# Patient Record
Sex: Male | Born: 1995 | Race: White | Hispanic: No | Marital: Single | Smoking: Never smoker
Health system: Southern US, Community
[De-identification: ages and names within clinical notes are randomized; demographics above are authoritative.]

## PROBLEM LIST (undated history)

## (undated) DIAGNOSIS — Z8489 Family history of other specified conditions: Secondary | ICD-10-CM

## (undated) DIAGNOSIS — F419 Anxiety disorder, unspecified: Secondary | ICD-10-CM

## (undated) DIAGNOSIS — K219 Gastro-esophageal reflux disease without esophagitis: Secondary | ICD-10-CM

---
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
## 2019-02-02 ENCOUNTER — Other Ambulatory Visit: Payer: Self-pay

## 2019-02-02 ENCOUNTER — Ambulatory Visit (INDEPENDENT_AMBULATORY_CARE_PROVIDER_SITE_OTHER): Payer: BC Managed Care – PPO | Admitting: Psychiatry

## 2019-02-02 ENCOUNTER — Encounter (HOSPITAL_COMMUNITY): Payer: Self-pay | Admitting: Psychiatry

## 2019-02-02 DIAGNOSIS — F411 Generalized anxiety disorder: Secondary | ICD-10-CM

## 2019-02-02 MED ORDER — SERTRALINE HCL 100 MG PO TABS: 150.0000 mg | ORAL_TABLET | Freq: Every day | ORAL | 1 refills | Status: DC
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
## 2019-02-25 ENCOUNTER — Other Ambulatory Visit (HOSPITAL_COMMUNITY): Payer: Self-pay | Admitting: Psychiatry

## 2019-03-23 ENCOUNTER — Ambulatory Visit (INDEPENDENT_AMBULATORY_CARE_PROVIDER_SITE_OTHER): Payer: BC Managed Care – PPO | Admitting: Psychiatry

## 2019-03-23 ENCOUNTER — Other Ambulatory Visit: Payer: Self-pay

## 2019-03-23 DIAGNOSIS — F411 Generalized anxiety disorder: Secondary | ICD-10-CM
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
## 2019-06-22 ENCOUNTER — Ambulatory Visit (INDEPENDENT_AMBULATORY_CARE_PROVIDER_SITE_OTHER): Payer: BC Managed Care – PPO | Admitting: Psychiatry

## 2019-06-22 ENCOUNTER — Other Ambulatory Visit: Payer: Self-pay

## 2019-06-22 DIAGNOSIS — F411 Generalized anxiety disorder: Secondary | ICD-10-CM

## 2019-06-22 MED ORDER — SERTRALINE HCL 100 MG PO TABS: ORAL_TABLET | ORAL | 1 refills | Status: DC
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
## 2019-09-20 ENCOUNTER — Ambulatory Visit (INDEPENDENT_AMBULATORY_CARE_PROVIDER_SITE_OTHER): Payer: BC Managed Care – PPO | Admitting: Psychiatry

## 2019-09-20 ENCOUNTER — Other Ambulatory Visit: Payer: Self-pay

## 2019-09-20 DIAGNOSIS — F411 Generalized anxiety disorder: Secondary | ICD-10-CM | POA: Diagnosis not present
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
## 2019-12-20 ENCOUNTER — Other Ambulatory Visit: Payer: Self-pay

## 2019-12-20 ENCOUNTER — Telehealth (INDEPENDENT_AMBULATORY_CARE_PROVIDER_SITE_OTHER): Payer: BC Managed Care – PPO | Admitting: Psychiatry

## 2019-12-20 DIAGNOSIS — F411 Generalized anxiety disorder: Secondary | ICD-10-CM

## 2019-12-20 MED ORDER — SERTRALINE HCL 50 MG PO TABS: 50.0000 mg | ORAL_TABLET | Freq: Every day | ORAL | 0 refills | Status: DC

## 2019-12-20 NOTE — Progress Notes (Signed)
BH MD/PA/NP OP Progress Note  12/20/2019 2:11 PM Marvin Kerr  MRN:  [id number] Interview was conducted by phone and I verified that I was speaking with the correct person using two identifiers. I discussed the limitations of evaluation and management by telemedicine and  the availability of in person appointments. Patient expressed understanding and agreed to proceed. Patient location - home; physician - home office.  Chief Complaint: Anxiety.  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 He has been under care of [name] at [facility] between 2016 and 2018. He has been successfully treated with sertraline (dose up to 150 mg) but after he went to college ([employer]) in 2018 he stopped seeing his provider and eventually stopped taking medication. Anxiety eventually returned. He transfered out of [employer] and resumed studies at [employer]. Marvin Kerr restarted sertraline but when the dose was increased to150 mg, Marvin Kerr was on in the past,he could not tolerate resulting nausea and decreased dose back to 100 mg. He has been forgetting to take it and eventually stopped it few weeks ago. Anxiety again came back and Marvin Kerr would like to restart sertraline perhaps at a lower dose.   Visit Diagnosis:    ICD-10-CM   1. GAD (generalized anxiety disorder)  F41.1     Past Psychiatric History: Please see intake H&P.  Past Medical History:  Past Medical History:  Diagnosis Date  . Anxiety   . Family history of adverse reaction to anesthesia    mother and maternal grandmother - PONV  . GERD (gastroesophageal reflux disease)   . Premature birth    32 wks. wt=3lb,5oz. stayed 1 month because of wt    Past Surgical History:  Procedure Laterality Date  . 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    Family Psychiatric History: None.  Family History: No family history on file.  Social History:  Social History   Socioeconomic History  . Marital status: Single    Spouse name: Not on file  . Number of children: 0  . Years of education: Not on file  . Highest education level: Not on file  Occupational History    Employer: [employer]  Tobacco Use  . Smoking status: Never Smoker  . Smokeless tobacco: Never Used  Substance and Sexual Activity  . Alcohol use: No  . Drug use: No  . Sexual activity: Not on file  Other Topics Concern  . Not on file  Social History Narrative  . Not on file   Social Determinants of Health   Financial Resource Strain:   . Difficulty of Paying Living Expenses:   Food Insecurity:   . Worried About Programme researcher, broadcasting/film/video in the Last Year:   . Barista in the Last Year:   Transportation Needs:   . Freight forwarder (Medical):   Marland Kitchen Lack of Transportation (Non-Medical):   Physical Activity:   . Days of Exercise per Week:   . Minutes of Exercise per Session:   Stress:   . Feeling of Stress :   Social Connections:   . Frequency of Communication with Friends and Family:   . Frequency of Social Gatherings with Friends and Family:   . Attends Religious Services:   . Active Member of Clubs or Organizations:   . Attends Club or  Organization Meetings:   Marland Kitchen Marital Status:     Allergies: No Known Allergies  Metabolic Disorder Labs: No results found for: HGBA1C, MPG No results found for: PROLACTIN No results found for: CHOL, TRIG, HDL, CHOLHDL, VLDL, LDLCALC Lab Results  Component Value Date   TSH 1.36 10/11/2013    Therapeutic Level Labs: No results found for: LITHIUM No results found for: VALPROATE No components found for:  CBMZ  Current Medications: Current Outpatient Medications  Medication Sig Dispense Refill  . sertraline (ZOLOFT) 50 MG tablet Take 1 tablet (50 mg total) by mouth daily. TAKE 1.5  TABLETS BY MOUTH IN THE MORNING 90 tablet 0   No current facility-administered medications for this visit.      Psychiatric Specialty Exam: Review of Systems  Psychiatric/Behavioral: The patient is nervous/anxious.   All other systems reviewed and are negative.   There were no vitals taken for this visit.There is no height or weight on file to calculate BMI.  General Appearance: NA  Eye Contact:  NA  Speech:  Clear and Coherent and Normal Rate  Volume:  Normal  Mood:  Anxious  Affect:  NA  Thought Process:  Goal Directed  Orientation:  Full (Time, Place, and Person)  Thought Content: Rumination   Suicidal Thoughts:  No  Homicidal Thoughts:  No  Memory:  Immediate;   Good Recent;   Good Remote;   Good  Judgement:  Good  Insight:  Good  Psychomotor Activity:  NA  Concentration:  Concentration: Good  Recall:  Good  Fund of Knowledge: Good  Language: Good  Akathisia:  Negative  Handed:  Right  AIMS (if indicated): not done  Assets:  Communication Skills Desire for Improvement Financial Resources/Insurance Housing Talents/Skills  ADL's:  Intact  Cognition: WNL  Sleep:  Good    Assessment and Plan: 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 He has been under care of Dr. Liana Gerold at [facility] between 2016 and 2018. He has been successfully treated with sertraline (dose up to 150 mg) but after he went to college ([employer]) in 2018 he stopped seeing his provider and eventually stopped taking medication. Anxiety eventually returned. He transfered out of [place] and resumed studies at [employer]. Purcell Kerr restarted sertraline but when the dose was increased to150 mg, Marvin Kerr was on in the past,he could not tolerate resulting nausea and decreased dose back to 100 mg. He has been forgetting to take  it and eventually stopped it few weeks ago. Anxiety again came back and Marvin Kerr would like to restart sertraline perhaps at a lower dose.   Dx: GAD   Plan:Restart sertralineat 50 mg daily - if anxiety does not subside after few weeks he can increase dose to 75 or 100 mg. Next appointment in3 monthsor prn.The plan was discussed with patient who had an opportunity to ask questions and these were all answered. I spend15 minutes inphone consultationwiththe patient.   Stephanie Acre, MD 12/20/2019, 2:11 PM

## 2020-03-21 ENCOUNTER — Telehealth (INDEPENDENT_AMBULATORY_CARE_PROVIDER_SITE_OTHER): Payer: BC Managed Care – PPO | Admitting: Psychiatry

## 2020-03-21 ENCOUNTER — Other Ambulatory Visit: Payer: Self-pay

## 2020-03-21 DIAGNOSIS — F411 Generalized anxiety disorder: Secondary | ICD-10-CM | POA: Diagnosis not present

## 2020-03-21 MED ORDER — SERTRALINE HCL 50 MG PO TABS: 50.0000 mg | ORAL_TABLET | Freq: Every day | ORAL | 1 refills | Status: DC

## 2020-03-21 NOTE — Progress Notes (Signed)
BH MD/PA/NP OP Progress Note  03/21/2020 2:07 PM Marvin Kerr  MRN:  [id number] Interview was conducted by phone and I verified that I was speaking with the correct person using two identifiers. I discussed the limitations of evaluation and management by telemedicine and  the availability of in person appointments. Patient expressed understanding and agreed to proceed. Patient location - home; physician - home office.  Chief Complaint: None.  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 He has been under care of [name] at [facility] between 2016 and 2018. He has been successfully treated with sertraline (dose up to 150 mg) but after he went to college ([employer]) in 2018 he stopped seeing his provider and eventually stopped taking medication. Anxiety eventually returned. He transfered out of [place] and resumed studies at [employer].Vela Prose restarted sertraline but when the dose was increased to150 mg, ashe was on in the past,he could not tolerate resulting nausea and decreased dose back to 100 mg. He has been forgetting to take it and eventually stopped it again only to experience return of anxiety. Over past three months he has been compliant with taking it at 50 mg daily and anxiety is well controlled.He denies feeling depressed, sleep and appetite are good, no SI.   Visit Diagnosis:    ICD-10-CM   1. GAD (generalized anxiety disorder)  F41.1     Past Psychiatric History: Please see intake H&P.  Past Medical History:  Past Medical History:  Diagnosis Date  . Anxiety   . Family history of adverse reaction to anesthesia    mother and maternal grandmother - PONV  . GERD (gastroesophageal reflux disease)   . Premature birth    32 wks. wt=3lb,5oz. stayed 1 month because of wt    Past Surgical  History:  Procedure Laterality Date  . 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    Family Psychiatric History: None.  Family History: No family history on file.  Social History:  Social History   Socioeconomic History  . Marital status: Single    Spouse name: Not on file  . Number of children: 0  . Years of education: Not on file  . Highest education level: Not on file  Occupational History    Employer: [employer]  Tobacco Use  . Smoking status: Never Smoker  . Smokeless tobacco: Never Used  Substance and Sexual Activity  . Alcohol use: No  . Drug use: No  . Sexual activity: Not on file  Other Topics Concern  . Not on file  Social History Narrative  . Not on file   Social Determinants of Health   Financial Resource Strain:   . Difficulty of Paying Living Expenses: Not on file  Food Insecurity:   . Worried About Programme researcher, broadcasting/film/video in the Last Year: Not on file  . Ran Out of Food in the Last Year: Not on file  Transportation Needs:   . Lack of Transportation (Medical): Not on file  . Lack of Transportation (Non-Medical): Not on file  Physical Activity:   . Days of Exercise per Week: Not on file  . Minutes of Exercise per Session: Not on file  Stress:   . Feeling of Stress : Not on file  Social Connections:   . Frequency of Communication with  Friends and Family: Not on file  . Frequency of Social Gatherings with Friends and Family: Not on file  . Attends Religious Services: Not on file  . Active Member of Clubs or Organizations: Not on file  . Attends Banker Meetings: Not on file  . Marital Status: Not on file    Allergies: No Known Allergies  Metabolic Disorder Labs: No results found for: HGBA1C, MPG No results found for: PROLACTIN No results found for: CHOL, TRIG, HDL, CHOLHDL, VLDL, LDLCALC Lab Results  Component Value Date   TSH 1.36 10/11/2013    Therapeutic Level Labs: No  results found for: LITHIUM No results found for: VALPROATE No components found for:  CBMZ  Current Medications: Current Outpatient Medications  Medication Sig Dispense Refill  . sertraline (ZOLOFT) 50 MG tablet Take 1 tablet (50 mg total) by mouth daily. 90 tablet 1   No current facility-administered medications for this visit.      Psychiatric Specialty Exam: Review of Systems  All other systems reviewed and are negative.   There were no vitals taken for this visit.There is no height or weight on file to calculate BMI.  General Appearance: NA  Eye Contact:  NA  Speech:  Clear and Coherent and Normal Rate  Volume:  Normal  Mood:  Euthymic  Affect:  NA  Thought Process:  Goal Directed and Linear  Orientation:  Full (Time, Place, and Person)  Thought Content: Logical   Suicidal Thoughts:  No  Homicidal Thoughts:  No  Memory:  Immediate;   Good Recent;   Good Remote;   Good  Judgement:  Good  Insight:  Good  Psychomotor Activity:  NA  Concentration:  Concentration: Good  Recall:  Good  Fund of Knowledge: Good  Language: Good  Akathisia:  Negative  Handed:  Right  AIMS (if indicated): not done  Assets:  Communication Skills Desire for Improvement Financial Resources/Insurance Housing Physical Health Social Support Vocational/Educational  ADL's:  Intact  Cognition: WNL  Sleep:  Good    Assessment and Plan: 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 He has been under care of [name] at [facility] between 2016 and 2018. He has been successfully treated with sertraline (dose up to 150 mg) but after he went to college ([employer]) in 2018 he stopped seeing his provider and eventually stopped taking medication. Anxiety eventually returned. He transfered out of [place] and resumed studies  at [employer].Vela Prose restarted sertraline but when the dose was increased to150 mg, ashe was on in the past,he could not tolerate resulting nausea and decreased dose back to 100 mg. He has been forgetting to take it and eventually stopped it again only to experience return of anxiety. Over past three months he has been compliant with taking it at 50 mg daily and anxiety is well controlled.He denies feeling depressed, sleep and appetite are good, no SI.  Dx: GAD   Plan:Continue sertralineat 50 mg daily. Next appointment in3 monthsor prn.The plan was discussed with patient who had an opportunity to ask questions and these were all answered. I spend66minutes inphone consultationwiththe patient.    [name], MD 03/21/2020, 2:07 PM

## 2020-03-26 ENCOUNTER — Other Ambulatory Visit (HOSPITAL_COMMUNITY): Payer: Self-pay | Admitting: Psychiatry

## 2020-06-20 ENCOUNTER — Telehealth (INDEPENDENT_AMBULATORY_CARE_PROVIDER_SITE_OTHER): Payer: BC Managed Care – PPO | Admitting: Psychiatry

## 2020-06-20 ENCOUNTER — Other Ambulatory Visit: Payer: Self-pay

## 2020-06-20 DIAGNOSIS — F411 Generalized anxiety disorder: Secondary | ICD-10-CM

## 2020-06-20 NOTE — Progress Notes (Signed)
BH MD/PA/NP OP Progress Note  06/20/2020 11:07 AM Marvin Kerr  MRN:  [id number] Interview was conducted by phone and I verified that I was speaking with the correct person using two identifiers. I discussed the limitations of evaluation and management by telemedicine and  the availability of in person appointments. Patient expressed understanding and agreed to proceed. Participants in the visit: patient (location - home); physician (location - home office).  Chief Complaint: None.  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 He has been under care of [name] at [facility] between 2016 and 2018. He has been successfully treated with sertraline (dose up to 150 mg) but after he went to college ([employer]) in 2018 he stopped seeing his provider and eventually stopped taking medication. Anxietyeventually returned. He transferedout of [employer] and resumed studies at [employer].Vela Prose restarted sertraline but when the dose was increased to150 mg, ashe was on in the past,he could not tolerate resulting nausea and decreased dose back to 100 mg.He has been forgetting to take it and eventually stopped it again only to experience return of anxiety. We restarted it and he has been compliant with taking 50 mg daily - anxiety is well controlled.He denies feeling depressed, sleep and appetite are good, no SI.   Visit Diagnosis:    ICD-10-CM   1. GAD (generalized anxiety disorder)  F41.1     Past Psychiatric History: Please see intake H&P.  Past Medical History:  Past Medical History:  Diagnosis Date  . Anxiety   . Family history of adverse reaction to anesthesia    mother and maternal grandmother - PONV  . GERD (gastroesophageal reflux disease)   . Premature birth    32 wks. wt=3lb,5oz. stayed 1 month  because of wt    Past Surgical History:  Procedure Laterality Date  . 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    Family Psychiatric History: None.  Family History: No family history on file.  Social History:  Social History   Socioeconomic History  . Marital status: Single    Spouse name: Not on file  . Number of children: 0  . Years of education: Not on file  . Highest education level: Not on file  Occupational History    Employer: [employer]  Tobacco Use  . Smoking status: Never Smoker  . Smokeless tobacco: Never Used  Substance and Sexual Activity  . Alcohol use: No  . Drug use: No  . Sexual activity: Not on file  Other Topics Concern  . Not on file  Social History Narrative  . Not on file   Social Determinants of Health   Financial Resource Strain: Not on file  Food Insecurity: Not on file  Transportation Needs: Not on file  Physical Activity: Not on file  Stress: Not on file  Social Connections: Not on file    Allergies: No Known Allergies  Metabolic Disorder Labs: No results found for: HGBA1C, MPG No results found for: PROLACTIN No results found for: CHOL, TRIG, HDL, CHOLHDL, VLDL, LDLCALC Lab Results  Component Value Date   TSH 1.36 10/11/2013    Therapeutic Level Labs: No results found for: LITHIUM No results found for: VALPROATE No components found for:  CBMZ  Current Medications: Current Outpatient Medications  Medication Sig Dispense Refill  . sertraline (ZOLOFT)  50 MG tablet Take 1 tablet (50 mg total) by mouth daily. 90 tablet 1   No current facility-administered medications for this visit.     Psychiatric Specialty Exam: Review of Systems  All other systems reviewed and are negative.   There were no vitals taken for this visit.There is no height or weight on file to calculate BMI.  General Appearance: NA  Eye Contact:  NA  Speech:  Clear and Coherent and Normal Rate   Volume:  Normal  Mood:  Euthymic  Affect:  NA  Thought Process:  Goal Directed and Linear  Orientation:  Full (Time, Place, and Person)  Thought Content: Logical   Suicidal Thoughts:  No  Homicidal Thoughts:  No  Memory:  Immediate;   Good Recent;   Good Remote;   Good  Judgement:  Good  Insight:  Good  Psychomotor Activity:  NA  Concentration:  Concentration: Good  Recall:  Good  Fund of Knowledge: Good  Language: Good  Akathisia:  Negative  Handed:  Right  AIMS (if indicated): not done  Assets:  Communication Skills Desire for Improvement Financial Resources/Insurance Housing Social Support Vocational/Educational  ADL's:  Intact  Cognition: WNL  Sleep:  Good    Assessment and Plan: 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 He has been under care of [name] at [facility] between 2016 and 2018. He has been successfully treated with sertraline (dose up to 150 mg) but after he went to college ([employer]) in 2018 he stopped seeing his provider and eventually stopped taking medication. Anxietyeventually returned. He transferedout of [employer] and resumed studies at [employer].Vela Prose restarted sertraline but when the dose was increased to150 mg, ashe was on in the past,he could not tolerate resulting nausea and decreased dose back to 100 mg.He has been forgetting to take it and eventually stopped it again only to experience return of anxiety. We restarted it and he has been compliant with taking 50 mg daily - anxiety is well controlled.He denies feeling depressed, sleep and appetite are good, no SI.  Dx: GAD   Plan:Continue sertralineat 50 mg daily. Next appointment in3 monthsor prn.The plan was discussed with patient who had an opportunity to ask questions and these  were all answered. I spend8minutes inphone consultationwiththe patient.   [name], MD 06/20/2020, 11:07 AM

## 2020-06-30 ENCOUNTER — Ambulatory Visit
Admission: EM | Admit: 2020-06-30 | Discharge: 2020-06-30 | Disposition: A | Discharge status: Home / Self Care | Payer: BC Managed Care – PPO

## 2020-06-30 ENCOUNTER — Encounter: Payer: Self-pay | Admitting: Emergency Medicine

## 2020-06-30 ENCOUNTER — Other Ambulatory Visit: Payer: Self-pay

## 2020-06-30 DIAGNOSIS — R059 Cough, unspecified: Secondary | ICD-10-CM | POA: Diagnosis not present

## 2020-06-30 DIAGNOSIS — M791 Myalgia, unspecified site: Secondary | ICD-10-CM | POA: Diagnosis not present

## 2020-06-30 DIAGNOSIS — R0981 Nasal congestion: Secondary | ICD-10-CM | POA: Diagnosis not present

## 2020-06-30 DIAGNOSIS — U071 COVID-19: Secondary | ICD-10-CM

## 2020-06-30 NOTE — Discharge Instructions (Addendum)
All of your vital signs are normal.  Your exam is reassuring.  Your chest is clear when I listen to your heart sounds normal.  There is no concern for any heart condition or lung problem due to Covid infection.  You need to isolate for 5 more days.  Go home and rest. Push fluids. Take Tylenol as needed for discomfort. Gargle warm salt water. Throat lozenges. Take Mucinex DM or Robitussin for cough. Humidifier in bedroom to ease coughing. Warm showers. Also review the COVID handout for more information.  COVID-19 INFECTION: The incubation period of COVID-19 is approximately 14 days after exposure, with most symptoms developing in roughly 4-5 days. Symptoms may range in severity from mild to critically severe. Roughly 80% of those infected will have mild symptoms. People of any age may become infected with COVID-19 and have the ability to transmit the virus. The most common symptoms include: fever, fatigue, cough, body aches, headaches, sore throat, nasal congestion, shortness of breath, nausea, vomiting, diarrhea, changes in smell and/or taste.    COURSE OF ILLNESS Some patients may begin with mild disease which can progress quickly into critical symptoms. If your symptoms are worsening please call ahead to the Emergency Department and proceed there for further treatment. Recovery time appears to be roughly 1-2 weeks for mild symptoms and 3-6 weeks for severe disease.   GO IMMEDIATELY TO ER FOR FEVER YOU ARE UNABLE TO GET DOWN WITH TYLENOL, BREATHING PROBLEMS, CHEST PAIN, FATIGUE, LETHARGY, INABILITY TO EAT OR DRINK, ETC  QUARANTINE AND ISOLATION: To help decrease the spread of COVID-19 please remain isolated if you have COVID infection or are highly suspected to have COVID infection. This means -stay home and isolate to one room in the home if you live with others. Do not share a bed or bathroom with others while ill, sanitize and wipe down all countertops and keep common areas clean and disinfected. You  may discontinue isolation if you have a mild case and are asymptomatic 10 days after symptom onset as long as you have been fever free >24 hours without having to take Motrin or Tylenol. If your case is more severe (meaning you develop pneumonia or are admitted in the hospital), you may have to isolate longer.   If you have been in close contact (within 6 feet) of someone diagnosed with COVID 19, you are advised to quarantine in your home for 14 days as symptoms can develop anywhere from 2-14 days after exposure to the virus. If you develop symptoms, you  must isolate.  Most current guidelines for COVID after exposure -isolate 10 days if you ARE NOT tested for COVID as long as symptoms do not develop -isolate 7 days if you are tested and remain asymptomatic -You do not necessarily need to be tested for COVID if you have + exposure and        develop   symptoms. Just isolate at home x10 days from symptom onset During this global pandemic, CDC advises to practice social distancing, try to stay at least 35ft away from others at all times. Wear a face covering. Wash and sanitize your hands regularly and avoid going anywhere that is not necessary.  KEEP IN MIND THAT THE COVID TEST IS NOT 100% ACCURATE AND YOU SHOULD STILL DO EVERYTHING TO PREVENT POTENTIAL SPREAD OF VIRUS TO OTHERS (WEAR MASK, WEAR GLOVES, WASH HANDS AND SANITIZE REGULARLY). IF INITIAL TEST IS NEGATIVE, THIS MAY NOT MEAN YOU ARE DEFINITELY NEGATIVE. MOST ACCURATE TESTING IS DONE 5-7  DAYS AFTER EXPOSURE.   It is not advised by CDC to get re-tested after receiving a positive COVID test since you can still test positive for weeks to months after you have already cleared the virus.   *If you have not been vaccinated for COVID, I strongly suggest you consider getting vaccinated as long as there are no contraindications.

## 2020-06-30 NOTE — ED Provider Notes (Signed)
[facility]    CSN: [id number] Arrival date & time: 06/30/20  1322      History   Chief Complaint Chief Complaint  Patient presents with  . Cough    COVID+    HPI Marvin Kerr is a 24 y.o. male presenting for cough, congestion, body aches, fatigue x5 days.  Patient states that symptoms began with a mild cough.  He states that the next couple of days he began to experience significant body aches which have gotten a little better.  Patient states that his cough has recently become productive over the past couple of days.  Symptoms are not worsening.  Patient states that he was tested for COVID-19 through [facility] 2 days ago and received the positive result yesterday.  Patient has been taking Sudafed and Tylenol with improvement in his symptoms.  Patient admits that he has significant anxiety and wanted to just get checked out to make sure everything is okay.  He denies any associated fever, chest pain, breathing difficulty, palpitations, dizziness, weakness, syncope, abdominal pain, nausea/vomiting/diarrhea, or changes in smell and taste.  Patient is otherwise healthy without any cardiopulmonary disease.  He has no other complaints or concerns today.  HPI  Past Medical History:  Diagnosis Date  . Anxiety   . Family history of adverse reaction to anesthesia    mother and maternal grandmother - PONV  . GERD (gastroesophageal reflux disease)   . Premature birth    32 wks. wt=3lb,5oz. stayed 1 month because of wt    Patient Active Problem List   Diagnosis Date Noted  . GAD (generalized anxiety disorder) 02/02/2019    Past Surgical History:  Procedure Laterality Date  . 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       Home Medications    Prior to Admission medications   Medication Sig Start Date End Date Taking? Authorizing Provider  sertraline (ZOLOFT) 50 MG tablet Take 1 tablet (50 mg total) by mouth  daily. 03/21/20 09/17/20 Yes Pucilowski, Roosvelt Maser, MD    Family History History reviewed. No pertinent family history.  Social History Social History   Tobacco Use  . Smoking status: Never Smoker  . Smokeless tobacco: Never Used  Vaping Use  . Vaping Use: Never used  Substance Use Topics  . Alcohol use: No  . Drug use: No     Allergies   Patient has no known allergies.   Review of Systems Review of Systems  Constitutional: Positive for fatigue. Negative for fever.  HENT: Positive for congestion and rhinorrhea. Negative for sinus pressure, sinus pain and sore throat.   Respiratory: Positive for cough. Negative for shortness of breath.   Gastrointestinal: Negative for abdominal pain, diarrhea, nausea and vomiting.  Musculoskeletal: Positive for myalgias.  Neurological: Negative for weakness, light-headedness and headaches.  Hematological: Negative for adenopathy.     Physical Exam Triage Vital Signs ED Triage Vitals  Enc Vitals Group     BP 06/30/20 1410 120/80     Pulse Rate 06/30/20 1410 88     Resp 06/30/20 1410 16     Temp 06/30/20 1410 98 F (36.7 C)     Temp Source 06/30/20 1410 Oral     SpO2 06/30/20 1410 100 %     Weight 06/30/20 1407 125 lb (56.7 kg)     Height 06/30/20 1407 5\' 10"  (1.778 m)     Head Circumference --  Peak Flow --      Pain Score 06/30/20 1407 3     Pain Loc --      Pain Edu? --      Excl. in GC? --    No data found.  Updated Vital Signs BP 120/80 (BP Location: Right Arm)   Pulse 88   Temp 98 F (36.7 C) (Oral)   Resp 16   Ht 5\' 10"  (1.778 m)   Wt 125 lb (56.7 kg)   SpO2 100%   BMI 17.94 kg/m       Physical Exam Vitals and nursing note reviewed.  Constitutional:      General: He is not in acute distress.    Appearance: Normal appearance. He is well-developed and well-nourished. He is not ill-appearing or diaphoretic.  HENT:     Head: Normocephalic and atraumatic.     Nose: Congestion and rhinorrhea present.      Mouth/Throat:     Mouth: Oropharynx is clear and moist and mucous membranes are normal.     Pharynx: Uvula midline. No oropharyngeal exudate.     Tonsils: No tonsillar abscesses.  Eyes:     General: No scleral icterus.       Right eye: No discharge.        Left eye: No discharge.     Extraocular Movements: EOM normal.     Conjunctiva/sclera: Conjunctivae normal.  Neck:     Thyroid: No thyromegaly.     Trachea: No tracheal deviation.  Cardiovascular:     Rate and Rhythm: Normal rate and regular rhythm.     Heart sounds: Normal heart sounds.  Pulmonary:     Effort: Pulmonary effort is normal. No respiratory distress.     Breath sounds: Normal breath sounds. No wheezing, rhonchi or rales.  Musculoskeletal:     Cervical back: Neck supple.  Skin:    General: Skin is warm and dry.     Findings: No rash.  Neurological:     General: No focal deficit present.     Mental Status: He is alert. Mental status is at baseline.     Motor: No weakness.     Gait: Gait normal.  Psychiatric:        Mood and Affect: Mood normal.        Behavior: Behavior normal.        Thought Content: Thought content normal.      UC Treatments / Results  Labs (all labs ordered are listed, but only abnormal results are displayed) Labs Reviewed - No data to display  EKG   Radiology No results found.  Procedures Procedures (including critical care time)  Medications Ordered in UC Medications - No data to display  Initial Impression / Assessment and Plan / UC Course  I have reviewed the triage vital signs and the nursing notes.  Pertinent labs & imaging results that were available during my care of the patient were reviewed by me and considered in my medical decision making (see chart for details).   24 year old male with [facility] presenting for examination today.  I was able to access the positive result from 2 days ago through the Care Everywhere tab.   All vital  signs are stable.  Patient is well-appearing and in no acute distress.  He has some minor nasal congestion and rhinorrhea on exam.  Chest is clear to auscultation heart regular rate and rhythm.  Patient seems satisfied with hearing that his vitals are  all normal and his exam is reassuring.  Holding off on chest x-ray at this point due to reassuring findings and patient has not complained of any chest pain or breathing difficulty.  CDC guidelines, isolation protocol and ED precautions reviewed with patient.  Advised to continue supportive care with the over-the-counter medications as prescribed.  Advised to follow-up with our department as needed.   Final Clinical Impressions(s) / UC Diagnoses   Final diagnoses:  COVID-19  Cough  Nasal congestion  Myalgia     Discharge Instructions     All of your vital signs are normal.  Your exam is reassuring.  Your chest is clear when I listen to your heart sounds normal.  There is no concern for any heart condition or lung problem due to Covid infection.  You need to isolate for 5 more days.  Go home and rest. Push fluids. Take Tylenol as needed for discomfort. Gargle warm salt water. Throat lozenges. Take Mucinex DM or Robitussin for cough. Humidifier in bedroom to ease coughing. Warm showers. Also review the COVID handout for more information.  COVID-19 INFECTION: The incubation period of COVID-19 is approximately 14 days after exposure, with most symptoms developing in roughly 4-5 days. Symptoms may range in severity from mild to critically severe. Roughly 80% of those infected will have mild symptoms. People of any age may become infected with COVID-19 and have the ability to transmit the virus. The most common symptoms include: fever, fatigue, cough, body aches, headaches, sore throat, nasal congestion, shortness of breath, nausea, vomiting, diarrhea, changes in smell and/or taste.    COURSE OF ILLNESS Some patients may begin with mild disease which  can progress quickly into critical symptoms. If your symptoms are worsening please call ahead to the Emergency Department and proceed there for further treatment. Recovery time appears to be roughly 1-2 weeks for mild symptoms and 3-6 weeks for severe disease.   GO IMMEDIATELY TO ER FOR FEVER YOU ARE UNABLE TO GET DOWN WITH TYLENOL, BREATHING PROBLEMS, CHEST PAIN, FATIGUE, LETHARGY, INABILITY TO EAT OR DRINK, ETC  QUARANTINE AND ISOLATION: To help decrease the spread of COVID-19 please remain isolated if you have COVID infection or are highly suspected to have COVID infection. This means -stay home and isolate to one room in the home if you live with others. Do not share a bed or bathroom with others while ill, sanitize and wipe down all countertops and keep common areas clean and disinfected. You may discontinue isolation if you have a mild case and are asymptomatic 10 days after symptom onset as long as you have been fever free >24 hours without having to take Motrin or Tylenol. If your case is more severe (meaning you develop pneumonia or are admitted in the hospital), you may have to isolate longer.   If you have been in close contact (within 6 feet) of someone diagnosed with COVID 19, you are advised to quarantine in your home for 14 days as symptoms can develop anywhere from 2-14 days after exposure to the virus. If you develop symptoms, you  must isolate.  Most current guidelines for COVID after exposure -isolate 10 days if you ARE NOT tested for COVID as long as symptoms do not develop -isolate 7 days if you are tested and remain asymptomatic -You do not necessarily need to be tested for COVID if you have + exposure and        develop   symptoms. Just isolate at home x10 days from symptom  onset During this global pandemic, CDC advises to practice social distancing, try to stay at least 58ft away from others at all times. Wear a face covering. Wash and sanitize your hands regularly and avoid going  anywhere that is not necessary.  KEEP IN MIND THAT THE COVID TEST IS NOT 100% ACCURATE AND YOU SHOULD STILL DO EVERYTHING TO PREVENT POTENTIAL SPREAD OF VIRUS TO OTHERS (WEAR MASK, WEAR GLOVES, WASH HANDS AND SANITIZE REGULARLY). IF INITIAL TEST IS NEGATIVE, THIS MAY NOT MEAN YOU ARE DEFINITELY NEGATIVE. MOST ACCURATE TESTING IS DONE 5-7 DAYS AFTER EXPOSURE.   It is not advised by CDC to get re-tested after receiving a positive COVID test since you can still test positive for weeks to months after you have already cleared the virus.   *If you have not been vaccinated for COVID, I strongly suggest you consider getting vaccinated as long as there are no contraindications.      ED Prescriptions    None     PDMP not reviewed this encounter.   Shirlee Latch, PA-C 06/30/20 1435

## 2020-06-30 NOTE — ED Triage Notes (Signed)
Patient states that [facility] yesterday.  Patient reports ongoing cough, bodyaches and runny nose.  Patient denies recent fevers.

## 2020-07-01 ENCOUNTER — Ambulatory Visit (HOSPITAL_COMMUNITY): Admit: 2020-07-01 | Discharge status: Against Medical Advice | Payer: BC Managed Care – PPO

## 2020-09-15 ENCOUNTER — Telehealth (HOSPITAL_COMMUNITY): Payer: BC Managed Care – PPO | Admitting: Psychiatry

## 2020-09-18 ENCOUNTER — Telehealth (INDEPENDENT_AMBULATORY_CARE_PROVIDER_SITE_OTHER): Payer: BC Managed Care – PPO | Admitting: Psychiatry

## 2020-09-18 ENCOUNTER — Other Ambulatory Visit: Payer: Self-pay

## 2020-09-18 DIAGNOSIS — F411 Generalized anxiety disorder: Secondary | ICD-10-CM

## 2020-09-18 MED ORDER — SERTRALINE HCL 50 MG PO TABS: 50.0000 mg | ORAL_TABLET | Freq: Every day | ORAL | 3 refills | Status: AC

## 2020-09-18 NOTE — Progress Notes (Signed)
BH MD/PA/NP OP Progress Note  09/18/2020 10:39 AM Marvin Kerr  MRN:  [id number] Interview was conducted by phone and I verified that I was speaking with the correct person using two identifiers. I discussed the limitations of evaluation and management by telemedicine and  the availability of in person appointments. Patient expressed understanding and agreed to proceed. Participants in the visit: patient (location - home); physician (location - home office).  Chief Complaint: None.  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 He has been under care of [name] at [facility] between 2016 and 2018. He has been successfully treated with sertraline (dose up to 150 mg) but after he went to college ([employer]) in 2018 he stopped seeing his provider and eventually stopped taking medication. Anxietyeventually returned. He transferedout of [employer] and resumed studies at [employer].Vela Prose restarted sertraline but when the dose was increased to150 mg, ashe was on in the past,he could not tolerate resulting nausea and decreased dose back to 100 mg.He has been forgetting to take it and eventually stopped itagain only to experience return of anxiety. We restarted it and he has been compliant with taking 50 mg daily - anxiety is well controlled.He denies feeling depressed, sleep and appetite are good, no SI.   Visit Diagnosis:    ICD-10-CM   1. GAD (generalized anxiety disorder)  F41.1     Past Psychiatric History: Please see intake H&P.  Past Medical History:  Past Medical History:  Diagnosis Date  . Anxiety   . Family history of adverse reaction to anesthesia    mother and maternal grandmother - PONV  . GERD (gastroesophageal reflux disease)   . Premature birth    32 wks. wt=3lb,5oz.  stayed 1 month because of wt    Past Surgical History:  Procedure Laterality Date  . 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    Family Psychiatric History: None.  Family History: No family history on file.  Social History:  Social History   Socioeconomic History  . Marital status: Single    Spouse name: Not on file  . Number of children: 0  . Years of education: Not on file  . Highest education level: Not on file  Occupational History    Employer: [employer]  Tobacco Use  . Smoking status: Never Smoker  . Smokeless tobacco: Never Used  Vaping Use  . Vaping Use: Never used  Substance and Sexual Activity  . Alcohol use: No  . Drug use: No  . Sexual activity: Not on file  Other Topics Concern  . Not on file  Social History Narrative  . Not on file   Social Determinants of Health   Financial Resource Strain: Not on file  Food Insecurity: Not on file  Transportation Needs: Not on file  Physical Activity: Not on file  Stress: Not on file  Social Connections: Not on file    Allergies: No Known Allergies  Metabolic Disorder Labs: No results found for: HGBA1C, MPG No results found for: PROLACTIN No results found for: CHOL, TRIG, HDL, CHOLHDL, VLDL, LDLCALC Lab Results  Component Value Date   TSH 1.36 10/11/2013    Therapeutic Level Labs: No results found for: LITHIUM No results found for: VALPROATE No components found for:  CBMZ  Current Medications: Current Outpatient Medications  Medication Sig Dispense Refill  . sertraline (ZOLOFT) 50 MG tablet Take 1 tablet (50 mg total) by mouth daily. 90 tablet 3   No current facility-administered medications for this visit.    Psychiatric Specialty Exam: Review of Systems  All other systems reviewed and are negative.   There were no vitals taken for this visit.There is no height or weight on file to calculate BMI.  General Appearance: NA  Eye  Contact:  NA  Speech:  Clear and Coherent and Normal Rate  Volume:  Normal  Mood:  Euthymic  Affect:  NA  Thought Process:  Goal Directed and Linear  Orientation:  Full (Time, Place, and Person)  Thought Content: Logical   Suicidal Thoughts:  No  Homicidal Thoughts:  No  Memory:  Immediate;   Good Recent;   Good Remote;   Good  Judgement:  Good  Insight:  Good  Psychomotor Activity:  NA  Concentration:  Concentration: Good  Recall:  Good  Fund of Knowledge: Good  Language: Good  Akathisia:  Negative  Handed:  Right  AIMS (if indicated): not done  Assets:  Solicitor Social Support Vocational/Educational  ADL's:  Intact  Cognition: WNL  Sleep:  Good    Assessment and Plan: 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 He has been under care of [name] at [facility] between 2016 and 2018. He has been successfully treated with sertraline (dose up to 150 mg) but after he went to college ([employer]) in 2018 he stopped seeing his provider and eventually stopped taking medication. Anxietyeventually returned. He transferedout of [employer] and resumed studies at [employer] Prose restarted sertraline but when the dose was increased to150 mg, ashe was on in the past,he could not tolerate resulting nausea and decreased dose back to 100 mg.He has been forgetting to take it and eventually stopped itagain only to experience return of anxiety. We restarted it and he has been compliant with taking 50 mg daily - anxiety is well controlled.He denies feeling depressed, sleep and appetite are good, no SI.  Dx: GAD   Plan:Continuesertralineat 50 mg daily.He plans to ask his PCP if they will be willing to continue prescribing sertraline and if  not he will remain with our practice. The plan was discussed with patient who had an opportunity to ask questions and these were all answered. I spend9minutes inphone consultationwiththe patient.    Marvin Patricia, MD 09/18/2020, 10:39 AM
